# Patient Record
Sex: Male | Born: 1945 | Race: White | Hispanic: No | Marital: Married | Smoking: Never smoker
Health system: Southern US, Community
[De-identification: ages and names within clinical notes are randomized; demographics above are authoritative.]

## PROBLEM LIST (undated history)

## (undated) DIAGNOSIS — IMO0001 Reserved for inherently not codable concepts without codable children: Secondary | ICD-10-CM

## (undated) DIAGNOSIS — R591 Generalized enlarged lymph nodes: Secondary | ICD-10-CM

## (undated) DIAGNOSIS — N138 Other obstructive and reflux uropathy: Secondary | ICD-10-CM

## (undated) DIAGNOSIS — M51369 Other intervertebral disc degeneration, lumbar region without mention of lumbar back pain or lower extremity pain: Secondary | ICD-10-CM

## (undated) DIAGNOSIS — J189 Pneumonia, unspecified organism: Secondary | ICD-10-CM

## (undated) DIAGNOSIS — D381 Neoplasm of uncertain behavior of trachea, bronchus and lung: Secondary | ICD-10-CM

## (undated) DIAGNOSIS — C801 Malignant (primary) neoplasm, unspecified: Secondary | ICD-10-CM

## (undated) DIAGNOSIS — I739 Peripheral vascular disease, unspecified: Secondary | ICD-10-CM

## (undated) DIAGNOSIS — M5136 Other intervertebral disc degeneration, lumbar region: Secondary | ICD-10-CM

## (undated) DIAGNOSIS — E7800 Pure hypercholesterolemia, unspecified: Secondary | ICD-10-CM

## (undated) DIAGNOSIS — R599 Enlarged lymph nodes, unspecified: Secondary | ICD-10-CM

## (undated) DIAGNOSIS — R918 Other nonspecific abnormal finding of lung field: Secondary | ICD-10-CM

## (undated) DIAGNOSIS — R490 Dysphonia: Secondary | ICD-10-CM

## (undated) DIAGNOSIS — N401 Enlarged prostate with lower urinary tract symptoms: Secondary | ICD-10-CM

## (undated) HISTORY — PX: HEMORRHOID SURGERY: SHX153

## (undated) HISTORY — DX: Benign prostatic hyperplasia with lower urinary tract symptoms: N40.1

## (undated) HISTORY — DX: Pure hypercholesterolemia, unspecified: E78.00

## (undated) HISTORY — DX: Enlarged lymph nodes, unspecified: R59.9

## (undated) HISTORY — DX: Neoplasm of uncertain behavior of trachea, bronchus and lung: D38.1

## (undated) HISTORY — PX: COLONOSCOPY: SHX174

## (undated) HISTORY — PX: HERNIA REPAIR: SHX51

## (undated) HISTORY — PX: TONSILLECTOMY: SUR1361

## (undated) HISTORY — DX: Dysphonia: R49.0

## (undated) HISTORY — DX: Generalized enlarged lymph nodes: R59.1

## (undated) HISTORY — DX: Other obstructive and reflux uropathy: N13.8

## (undated) HISTORY — DX: Other nonspecific abnormal finding of lung field: R91.8

---

## 2014-10-16 ENCOUNTER — Other Ambulatory Visit (HOSPITAL_COMMUNITY): Payer: Self-pay | Admitting: Family Medicine

## 2014-10-16 DIAGNOSIS — R918 Other nonspecific abnormal finding of lung field: Secondary | ICD-10-CM

## 2014-10-24 ENCOUNTER — Ambulatory Visit (HOSPITAL_COMMUNITY)
Admission: RE | Admit: 2014-10-24 | Discharge: 2014-10-24 | Disposition: A | Discharge status: Home / Self Care | Payer: Medicare Other | Source: Ambulatory Visit | Attending: Family Medicine | Admitting: Family Medicine

## 2014-10-24 DIAGNOSIS — K769 Liver disease, unspecified: Secondary | ICD-10-CM | POA: Diagnosis not present

## 2014-10-24 DIAGNOSIS — I6523 Occlusion and stenosis of bilateral carotid arteries: Secondary | ICD-10-CM | POA: Diagnosis not present

## 2014-10-24 DIAGNOSIS — J90 Pleural effusion, not elsewhere classified: Secondary | ICD-10-CM | POA: Diagnosis not present

## 2014-10-24 DIAGNOSIS — R590 Localized enlarged lymph nodes: Secondary | ICD-10-CM | POA: Diagnosis not present

## 2014-10-24 DIAGNOSIS — N433 Hydrocele, unspecified: Secondary | ICD-10-CM | POA: Insufficient documentation

## 2014-10-24 DIAGNOSIS — I517 Cardiomegaly: Secondary | ICD-10-CM | POA: Insufficient documentation

## 2014-10-24 DIAGNOSIS — I2510 Atherosclerotic heart disease of native coronary artery without angina pectoris: Secondary | ICD-10-CM | POA: Insufficient documentation

## 2014-10-24 DIAGNOSIS — R918 Other nonspecific abnormal finding of lung field: Secondary | ICD-10-CM | POA: Insufficient documentation

## 2014-10-24 LAB — GLUCOSE, CAPILLARY: Glucose-Capillary: 83 mg/dL (ref 65–99)

## 2014-10-24 MED ORDER — FLUDEOXYGLUCOSE F - 18 (FDG) INJECTION
8.6500 | Freq: Once | INTRAVENOUS | Status: DC | PRN
  Administered 2014-10-24: 8.65 via INTRAVENOUS
  Filled 2014-10-24: qty 8.65

## 2014-10-30 ENCOUNTER — Institutional Professional Consult (permissible substitution) (INDEPENDENT_AMBULATORY_CARE_PROVIDER_SITE_OTHER): Payer: Medicare Other | Admitting: Cardiothoracic Surgery

## 2014-10-30 ENCOUNTER — Encounter: Payer: Self-pay | Admitting: Cardiothoracic Surgery

## 2014-10-30 ENCOUNTER — Encounter: Payer: Self-pay | Admitting: *Deleted

## 2014-10-30 ENCOUNTER — Other Ambulatory Visit: Payer: Self-pay | Admitting: *Deleted

## 2014-10-30 VITALS — BP 118/78 | HR 79 | Resp 16 | Ht 67.5 in | Wt 167.0 lb

## 2014-10-30 DIAGNOSIS — C3431 Malignant neoplasm of lower lobe, right bronchus or lung: Secondary | ICD-10-CM

## 2014-10-30 DIAGNOSIS — N401 Enlarged prostate with lower urinary tract symptoms: Secondary | ICD-10-CM

## 2014-10-30 DIAGNOSIS — R918 Other nonspecific abnormal finding of lung field: Secondary | ICD-10-CM | POA: Diagnosis not present

## 2014-10-30 DIAGNOSIS — D381 Neoplasm of uncertain behavior of trachea, bronchus and lung: Secondary | ICD-10-CM | POA: Insufficient documentation

## 2014-10-30 DIAGNOSIS — N138 Other obstructive and reflux uropathy: Secondary | ICD-10-CM | POA: Insufficient documentation

## 2014-10-30 DIAGNOSIS — R591 Generalized enlarged lymph nodes: Secondary | ICD-10-CM | POA: Diagnosis not present

## 2014-10-30 DIAGNOSIS — R599 Enlarged lymph nodes, unspecified: Secondary | ICD-10-CM

## 2014-10-30 DIAGNOSIS — R490 Dysphonia: Secondary | ICD-10-CM | POA: Insufficient documentation

## 2014-10-30 DIAGNOSIS — E7800 Pure hypercholesterolemia, unspecified: Secondary | ICD-10-CM | POA: Insufficient documentation

## 2014-10-30 NOTE — Progress Notes (Signed)
[address]       [place] [address]             [phone]                    Brady [name] Torboy Medical Record #[id number] Date of Birth: November 07, 1945  Referring: Everardo All, MD Primary Care: Celedonio Savage, MD  Chief Complaint:    Chief Complaint  Patient presents with  . Lung Lesion    multiple...CT'S, PET, MRI BRAIN  . Adenopathy    History of Present Illness:    Brady [name] 69 y.o. male is seen in the office  today for  Concern to extensive lung lesions and mediastinal adenopathy. 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  He is a nonsmoker.  He was in the [place] War however.  HHe is here today with his wife of many years and has one daughter. He has a son as well. Both children live in [place].  He t has not lost weight recently. He does not have fevers or night sweats. He does have a coughwhich started in July after he developed some hoarseness in May of this year. The cough seemed to get worse. He went to see his primary care physician who got a chest x-ray which was suggestive of pneumonia. He was placed on antibiotic's. The follow-up chest x-ray did not show resolution and therefore a CT scan of the chest was done on September 15, 2014. A follow-up CT scan of the chest was recommended and was done in September which revealed multiple pulmonary nodules consistent with a possible primary in the right lower lobe and metastases in both lungs as well as adenopathy He subsequently went on to have a PET scan The other day which revealed bone metastases as well as extensive lung disease. He does not necessarily have neurological symptoms but has not had a scan of his brain.he remains slightly short of breath with exertion.       Current Activity/ Functional  Status:  Patient is independent with mobility/ambulation, transfers, ADL's, IADL's.   Zubrod Score: At the time of surgery this patient's most appropriate activity status/level should be described as: '[]'$     0    Normal activity, no symptoms '[x]'$     1    Restricted in physical strenuous activity but ambulatory, able to do out light work '[]'$     2    Ambulatory and capable of self care, unable to do work activities, up and about               >50 % of waking hours                              '[]'$     3    Only limited self care, in bed greater than 50% of waking hours '[]'$     4    Completely disabled, no self care, confined to bed or chair '[]'$     5    Moribund   Past Medical History  Diagnosis Date  . Hypercholesterolemia   . Persistent hoarseness   . BPH with obstruction/lower urinary tract symptoms   . 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 Multiple pulmonary nodules     bilateral  . Adenopathy     No past surgical history on file.  Family History  Problem Relation Age of Onset  . Sudden death Father   . Cancer Sister 25    metastatic pancreatic cancer   His father died of sudden death at age 14 while teaching in high school. His mother died at age 32 of complications of thyrotoxicosis and other Issues. He has one sister who died at age 62 of metastatic cancer of the pancreas, he has a sister who is overweight with degenerative joint disease status post 3 knee replacements. He has one other brother who was alive and in fair health. The patient is youngest of these 4 children. Social History   Social History  . Marital Status: Married    Spouse Name: N/A  . Number of Children: N/A  . Years of Education: N/A   Occupational History  . Not on file.   Social History Main Topics  . Smoking status: Never Smoker   . Smokeless tobacco: Never Used  . Alcohol Use: 0.0 oz/week    0 Standard drinks or equivalent per week     Comment: EVERY MONTH IF THAT  . Drug  Use: Not on file  . Sexual Activity: Not on file   Other Topics Concern  . Not on file   Social History Narrative    History  Smoking status  . Never Smoker   Smokeless tobacco  . Never Used    History  Alcohol Use  . 0.0 oz/week  . 0 Standard drinks or equivalent per week    Comment: EVERY MONTH IF THAT     Not on File  Current Outpatient Prescriptions  Medication Sig Dispense Refill  . acetaminophen (TYLENOL) 500 MG tablet Take 500 mg by mouth every 6 (six) hours as needed. For pain    . albuterol (PROVENTIL HFA;VENTOLIN HFA) 108 (90 BASE) MCG/ACT inhaler Inhale 2 puffs into the lungs every 6 (six) hours as needed for wheezing or shortness of breath.    Marland Kitchen aspirin 81 MG chewable tablet Chew by mouth daily.    . cetirizine (ZYRTEC) 10 MG tablet Take 10 mg by mouth daily.    . clotrimazole (LOTRIMIN) 1 % cream Apply 1 application topically 2 (two) times daily. As needed    . finasteride (PROSCAR) 5 MG tablet Take 5 mg by mouth daily.    . fluticasone (FLONASE) 50 MCG/ACT nasal spray Place 1 spray into both nostrils daily as needed for allergies or rhinitis.    Marland Kitchen Hydrocodone-Chlorpheniramine (CHLORPHENIRAMINE-HYDROCODONE PO) Take 5 mLs by mouth every 12 (twelve) hours as needed.    . Omega-3 Fatty Acids (FISH OIL) 1000 MG CAPS Take 2 capsules by mouth daily.    Marland Kitchen omeprazole (PRILOSEC) 20 MG capsule Take 20 mg by mouth daily.    Marland Kitchen oxyCODONE-acetaminophen (PERCOCET/ROXICET) 5-325 MG tablet Take 1 tablet by mouth every 6 (six) hours as needed for severe pain.    . simvastatin (ZOCOR) 20 MG tablet Take 20 mg by mouth daily.    Marland Kitchen terazosin (HYTRIN) 5 MG capsule Take 5 mg by mouth at bedtime.     No current facility-administered medications for this visit.      Review of Systems:     Cardiac Review of Systems: Y or N  Chest Pain Florencio.Farrier   ]  Resting SOB [  y ] Exertional SOB  [ y]  Orthopnea Blue.Reese  ]   Pedal Edema [ n  ]    Palpitations Florencio.Farrier  ] Syncope  Florencio.Farrier  ]   Presyncope [ n   ]  General Review of Systems: [Y] = yes [  ]=no Constitional: recent weight change [  ];  Wt loss over the last 3 months [   ] anorexia [  ]; fatigue [  ]; nausea [  ]; night sweats [  ]; fever [  ]; or chills [  ];          Dental: poor dentition[  ]; Last Dentist visit:   Eye : blurred vision [  ]; diplopia [   ]; vision changes [  ];  Amaurosis fugax[  ]; Resp: cough [  ];  wheezing[  ];  hemoptysis[ n ]; shortness of breath[y  ]; paroxysmal nocturnal y]; dyspnea on exertion[y  ]; or orthopnea[ y ];  GI:  gallstones[  ], vomiting[  ];  dysphagia[  ]; melena[  ];  hematochezia [  ]; heartburn[  ];   Hx of  Colonoscopy[  ]; GU: kidney stones [  ]; hematuria[  ];   dysuria [  ];  nocturia[  ];  history of     obstruction [  ]; urinary frequency [  ]             Skin: rash, swelling[  ];, hair loss[  ];  peripheral edema[  ];  or itching[  ]; Musculosketetal: myalgias[  ];  joint swelling[  ];  joint erythema[  ];  joint pain[  ];  back pain[  ];  Heme/Lymph: bruising[  ];  bleeding[  ];  anemia[  ];  Neuro: TIA[  ];  headaches[  ];  stroke[  ];  vertigo[  ];  seizures[  ];   paresthesias[  ];  difficulty walking[  ];  Psych:depression[  ]; anxiety[  ];  Endocrine: diabetes[  ];  thyroid dysfunction[  ];  Immunizations: Flu up to date [  ]; Pneumococcal up to date [  ];  Other:  Physical Exam: BP 118/78 mmHg  Pulse 79  Resp 16  Ht 5' 7.5" (1.715 m)  Wt 167 lb (75.751 kg)  BMI 25.75 kg/m2  SpO2 96%  PHYSICAL EXAMINATION: General appearance: alert, cooperative and voice fatiques, hoarse Head: Normocephalic, without obvious abnormality, atraumatic Neck: no adenopathy, no carotid bruit, no JVD, supple, symmetrical, trachea midline and thyroid not enlarged, symmetric, no tenderness/mass/nodules Lymph nodes: Cervical, supraclavicular, and axillary nodes normal. Resp: diminished breath sounds bibasilar Back: symmetric, no curvature. ROM normal. No CVA tenderness. Cardio: regular rate and  rhythm, S1, S2 normal, no murmur, click, rub or gallop GI: soft, non-tender; bowel sounds normal; no masses,  no organomegaly Extremities: extremities normal, atraumatic, no cyanosis or edema Neurologic: Grossly normal  Diagnostic Studies & Laboratory data:     Recent Radiology Findings:   Nm Pet Image Initial (pi) Skull Base To Thigh  10/24/2014  CLINICAL DATA:  Initial treatment strategy for pulmonary nodules. EXAM: NUCLEAR MEDICINE PET SKULL BASE TO THIGH TECHNIQUE: 8.6 mCi F-18 FDG was injected intravenously. Full-ring PET imaging was performed from the skull base to thigh after the radiotracer. CT data was obtained and used for attenuation correction and anatomic localization. FASTING BLOOD GLUCOSE:  Value: 83 mg/dl COMPARISON:  Recent chest CTs, including 09/15/2014 and 10/16/2014. FINDINGS: NECK A focus of hypermetabolism in the low right jugular region, likely corresponding to a small lymph node. This  measures a S.U.V. max of 4.1 on image/series 45/4. CHEST Multi focal pulmonary and thoracic nodal hypermetabolism. An index partially cavitary posterior left upper lobe lung mass measures 3.5 cm and a S.U.V. max of 17.3 on image/series 33.7. Partially cavitary left lower lobe lung nodule measures 2.2 cm and a S.U.V. max of 13.9 on image/series 42/7 Heterogeneous hypermetabolism corresponding to masslike consolidation throughout the right lower lobe. No air bronchograms within. A right paratracheal node measures 8 mm and a S.U.V. max of 5.3 on image 61. Precarinal adenopathy measures 1.7 cm and a S.U.V. max of 6.4 on image 71. ABDOMEN/PELVIS Bilateral adrenal hypermetabolism is mild and relatively symmetric. No dominant nodule. No abdominal pelvic nodal hypermetabolism. SKELETON Multifocal osseous hypermetabolism. Example within the sacrum at a S.U.V. max of 11.0 on image/series 160/4. sternal body focus of hypermetabolism measures a S.U.V. max of 8.2. CT IMAGES PERFORMED FOR ATTENUATION CORRECTION  Bilateral carotid atherosclerosis. No cervical adenopathy. Chest findings deferred to recent diagnostic CTs. Mild cardiomegaly with coronary artery atherosclerosis. Small right larger than left pleural effusions are similar. Well-circumscribed low-density liver lesions are likely cysts. Abdominal aortic and branch vessel atherosclerosis. Trace cul-de-sac fluid. Mild prostatomegaly. Small bilateral hydroceles may be physiologic. Bilateral L5 pars defects. IMPRESSION: 1. Bilateral hypermetabolic pulmonary nodules, hypermetabolic thoracic nodes and masslike right lower lobe hypermetabolic opacity. Although some of these findings could be infectious or inflammatory, the presence of concurrent hypermetabolic osseous foci is consistent with widespread metastatic primary bronchogenic carcinoma or carcinomas. 2. Hypermetabolism within the low right neck, suspicious for nodal metastasis. 3. No definite extraosseous metastasis below the diaphragm ; adrenal hypermetabolism is without CT correlate and favored to be physiologic. 4.  Atherosclerosis, including within the coronary arteries. 5. Trace cul-de-sac fluid is nonspecific. 6. Small right larger than left pleural effusions are similar to 10/16/2014. Electronically Signed   By: Abigail Miyamoto M.D.   On: 10/24/2014 15:06     I have independently reviewed the above radiologic studies.  Recent Lab Findings: No results found for: WBC, HGB, HCT, PLT, GLUCOSE, CHOL, TRIG, HDL, LDLDIRECT, LDLCALC, ALT, AST, NA, K, CL, CREATININE, BUN, CO2, TSH, INR, GLUF, HGBA1C    Assessment / Plan:   bilateral lung lesions and  Mediastinal adenopathy suggestive of lung cancer. I have recommended proceeding with bronchoscopy, EBUS and mediastinoscopy and biopsy to obtain tissue dx. Will plan for Tuesday 10/18.      I  spent 40 minutes counseling the patient face to face and 50% or more the  time was spent in counseling and coordination of care. The total time spent in the appointment  was 60 minutes.  Grace Isaac MD      [address] [place] [address] Office [phone]   Beeper [phone]  10/30/2014 9:15 PM

## 2014-11-03 ENCOUNTER — Encounter (HOSPITAL_COMMUNITY): Payer: Self-pay | Admitting: *Deleted

## 2014-11-03 NOTE — Progress Notes (Signed)
Spoke with pt's wife, Arbie Cookey for pre-op call after getting verbal permission from pt.  She denies any cardiac history on pt. Denies any c/o chest pain. She states pt has had some sob due to recent lung cancer diagnosis.

## 2014-11-03 NOTE — Progress Notes (Signed)
Notified pt's wife, Arbie Cookey of surgery time change. Instructed her to have pt here at 7:00 AM for a 9:30 surgery start time. She voiced understanding.

## 2014-11-04 ENCOUNTER — Ambulatory Visit (HOSPITAL_COMMUNITY)
Admission: RE | Admit: 2014-11-04 | Discharge: 2014-11-04 | Disposition: A | Discharge status: Home / Self Care | Payer: Medicare Other | Source: Ambulatory Visit | Attending: Cardiothoracic Surgery | Admitting: Cardiothoracic Surgery

## 2014-11-04 ENCOUNTER — Ambulatory Visit (HOSPITAL_COMMUNITY): Payer: Medicare Other | Admitting: Certified Registered Nurse Anesthetist

## 2014-11-04 ENCOUNTER — Ambulatory Visit (HOSPITAL_COMMUNITY): Payer: Medicare Other

## 2014-11-04 ENCOUNTER — Encounter (HOSPITAL_COMMUNITY)
Admission: RE | Disposition: A | Discharge status: Home / Self Care | Payer: Self-pay | Source: Ambulatory Visit | Attending: Cardiothoracic Surgery

## 2014-11-04 ENCOUNTER — Encounter (HOSPITAL_COMMUNITY): Payer: Self-pay | Admitting: *Deleted

## 2014-11-04 DIAGNOSIS — C3431 Malignant neoplasm of lower lobe, right bronchus or lung: Secondary | ICD-10-CM | POA: Diagnosis not present

## 2014-11-04 DIAGNOSIS — E7800 Pure hypercholesterolemia, unspecified: Secondary | ICD-10-CM | POA: Diagnosis not present

## 2014-11-04 DIAGNOSIS — I739 Peripheral vascular disease, unspecified: Secondary | ICD-10-CM | POA: Insufficient documentation

## 2014-11-04 DIAGNOSIS — N138 Other obstructive and reflux uropathy: Secondary | ICD-10-CM | POA: Diagnosis not present

## 2014-11-04 DIAGNOSIS — M5136 Other intervertebral disc degeneration, lumbar region: Secondary | ICD-10-CM | POA: Insufficient documentation

## 2014-11-04 DIAGNOSIS — Z79899 Other long term (current) drug therapy: Secondary | ICD-10-CM | POA: Diagnosis not present

## 2014-11-04 DIAGNOSIS — N401 Enlarged prostate with lower urinary tract symptoms: Secondary | ICD-10-CM | POA: Diagnosis not present

## 2014-11-04 DIAGNOSIS — Z7982 Long term (current) use of aspirin: Secondary | ICD-10-CM | POA: Diagnosis not present

## 2014-11-04 DIAGNOSIS — R918 Other nonspecific abnormal finding of lung field: Secondary | ICD-10-CM

## 2014-11-04 DIAGNOSIS — C771 Secondary and unspecified malignant neoplasm of intrathoracic lymph nodes: Secondary | ICD-10-CM | POA: Insufficient documentation

## 2014-11-04 DIAGNOSIS — R911 Solitary pulmonary nodule: Secondary | ICD-10-CM

## 2014-11-04 DIAGNOSIS — I719 Aortic aneurysm of unspecified site, without rupture: Secondary | ICD-10-CM | POA: Insufficient documentation

## 2014-11-04 HISTORY — DX: Malignant (primary) neoplasm, unspecified: C80.1

## 2014-11-04 HISTORY — DX: Pneumonia, unspecified organism: J18.9

## 2014-11-04 HISTORY — PX: VIDEO BRONCHOSCOPY WITH ENDOBRONCHIAL ULTRASOUND: SHX6177

## 2014-11-04 HISTORY — DX: Other intervertebral disc degeneration, lumbar region without mention of lumbar back pain or lower extremity pain: M51.369

## 2014-11-04 HISTORY — PX: MEDIASTINOSCOPY: SHX5086

## 2014-11-04 HISTORY — DX: Peripheral vascular disease, unspecified: I73.9

## 2014-11-04 HISTORY — DX: Other intervertebral disc degeneration, lumbar region: M51.36

## 2014-11-04 HISTORY — DX: Reserved for inherently not codable concepts without codable children: IMO0001

## 2014-11-04 LAB — CBC
HCT: 40.4 % (ref 39.0–52.0)
Hemoglobin: 13.3 g/dL (ref 13.0–17.0)
MCH: 29.4 pg (ref 26.0–34.0)
MCHC: 32.9 g/dL (ref 30.0–36.0)
MCV: 89.2 fL (ref 78.0–100.0)
Platelets: 290 10*3/uL (ref 150–400)
RBC: 4.53 MIL/uL (ref 4.22–5.81)
RDW: 13 % (ref 11.5–15.5)
WBC: 10.9 10*3/uL — ABNORMAL HIGH (ref 4.0–10.5)

## 2014-11-04 LAB — SURGICAL PCR SCREEN
MRSA, PCR: NEGATIVE
Staphylococcus aureus: NEGATIVE

## 2014-11-04 LAB — COMPREHENSIVE METABOLIC PANEL
ALT: 39 U/L (ref 17–63)
AST: 25 U/L (ref 15–41)
Albumin: 2.8 g/dL — ABNORMAL LOW (ref 3.5–5.0)
Alkaline Phosphatase: 79 U/L (ref 38–126)
Anion gap: 10 (ref 5–15)
BUN: 9 mg/dL (ref 6–20)
CO2: 25 mmol/L (ref 22–32)
Calcium: 8.7 mg/dL — ABNORMAL LOW (ref 8.9–10.3)
Chloride: 100 mmol/L — ABNORMAL LOW (ref 101–111)
Creatinine, Ser: 0.83 mg/dL (ref 0.61–1.24)
GFR calc Af Amer: >60 mL/min (ref >60)
GFR calc non Af Amer: >60 mL/min (ref >60)
Glucose, Bld: 98 mg/dL (ref 65–99)
Potassium: 4 mmol/L (ref 3.5–5.1)
Sodium: 135 mmol/L (ref 135–145)
Total Bilirubin: 0.4 mg/dL (ref 0.3–1.2)
Total Protein: 6.6 g/dL (ref 6.5–8.1)

## 2014-11-04 LAB — TYPE AND SCREEN
ABO/RH(D): AB POS
Antibody Screen: NEGATIVE

## 2014-11-04 LAB — ABO/RH: ABO/RH(D): AB POS

## 2014-11-04 LAB — GRAM STAIN

## 2014-11-04 LAB — PROTIME-INR
INR: 1.19 (ref 0.00–1.49)
Prothrombin Time: 15.3 seconds — ABNORMAL HIGH (ref 11.6–15.2)

## 2014-11-04 LAB — APTT: aPTT: 33 seconds (ref 24–37)

## 2014-11-04 SURGERY — BRONCHOSCOPY, WITH EBUS
Anesthesia: General

## 2014-11-04 MED ORDER — CHLORHEXIDINE GLUCONATE CLOTH 2 % EX PADS: 6.0000 | MEDICATED_PAD | Freq: Once | CUTANEOUS | Status: DC

## 2014-11-04 MED ORDER — PHENYLEPHRINE HCL 10 MG/ML IJ SOLN
10.0000 mg | INTRAVENOUS | Status: DC | PRN
  Administered 2014-11-04: 25 ug/min via INTRAVENOUS

## 2014-11-04 MED ORDER — FENTANYL CITRATE (PF) 250 MCG/5ML IJ SOLN
INTRAMUSCULAR | Status: DC | PRN
  Administered 2014-11-04: 50 ug via INTRAVENOUS
  Administered 2014-11-04: 100 ug via INTRAVENOUS
  Administered 2014-11-04: 50 ug via INTRAVENOUS

## 2014-11-04 MED ORDER — EPHEDRINE SULFATE 50 MG/ML IJ SOLN
INTRAMUSCULAR | Status: AC
  Filled 2014-11-04: qty 1

## 2014-11-04 MED ORDER — GLYCOPYRROLATE 0.2 MG/ML IJ SOLN
INTRAMUSCULAR | Status: AC
  Filled 2014-11-04: qty 1

## 2014-11-04 MED ORDER — DEXTROSE 5 % IV SOLN
1.5000 g | INTRAVENOUS | Status: AC
  Administered 2014-11-04: 1.5 g via INTRAVENOUS
  Filled 2014-11-04: qty 1.5

## 2014-11-04 MED ORDER — VECURONIUM BROMIDE 10 MG IV SOLR
INTRAVENOUS | Status: DC | PRN
  Administered 2014-11-04: 1 mg via INTRAVENOUS
  Administered 2014-11-04: 3 mg via INTRAVENOUS
  Administered 2014-11-04: 1 mg via INTRAVENOUS

## 2014-11-04 MED ORDER — SODIUM CHLORIDE 0.9 % IJ SOLN
INTRAMUSCULAR | Status: AC
  Filled 2014-11-04: qty 10

## 2014-11-04 MED ORDER — FENTANYL CITRATE (PF) 250 MCG/5ML IJ SOLN
INTRAMUSCULAR | Status: AC
  Filled 2014-11-04: qty 5

## 2014-11-04 MED ORDER — PROPOFOL 10 MG/ML IV BOLUS
INTRAVENOUS | Status: DC | PRN
  Administered 2014-11-04: 200 mg via INTRAVENOUS

## 2014-11-04 MED ORDER — LIDOCAINE HCL 4 % MT SOLN
OROMUCOSAL | Status: DC | PRN
Start: 2014-11-04 — End: 2014-11-04
  Administered 2014-11-04: 4 mL via TOPICAL

## 2014-11-04 MED ORDER — MIDAZOLAM HCL 2 MG/2ML IJ SOLN
INTRAMUSCULAR | Status: AC
  Filled 2014-11-04: qty 4

## 2014-11-04 MED ORDER — FENTANYL CITRATE (PF) 100 MCG/2ML IJ SOLN: 25.0000 ug | INTRAMUSCULAR | Status: DC | PRN

## 2014-11-04 MED ORDER — 0.9 % SODIUM CHLORIDE (POUR BTL) OPTIME
TOPICAL | Status: DC | PRN
  Administered 2014-11-04: 1000 mL

## 2014-11-04 MED ORDER — LIDOCAINE HCL (CARDIAC) 20 MG/ML IV SOLN
INTRAVENOUS | Status: DC | PRN
  Administered 2014-11-04: 50 mg via INTRAVENOUS

## 2014-11-04 MED ORDER — SUGAMMADEX SODIUM 200 MG/2ML IV SOLN
INTRAVENOUS | Status: DC | PRN
  Administered 2014-11-04: 200 mg via INTRAVENOUS

## 2014-11-04 MED ORDER — PROPOFOL 10 MG/ML IV BOLUS
INTRAVENOUS | Status: AC
  Filled 2014-11-04: qty 20

## 2014-11-04 MED ORDER — MUPIROCIN 2 % EX OINT
1.0000 "application " | TOPICAL_OINTMENT | Freq: Once | CUTANEOUS | Status: AC
  Administered 2014-11-04: 1 via TOPICAL
  Filled 2014-11-04: qty 22

## 2014-11-04 MED ORDER — ROCURONIUM BROMIDE 50 MG/5ML IV SOLN
INTRAVENOUS | Status: AC
  Filled 2014-11-04: qty 1

## 2014-11-04 MED ORDER — LACTATED RINGERS IV SOLN
INTRAVENOUS | Status: DC
  Administered 2014-11-04 (×2): via INTRAVENOUS

## 2014-11-04 MED ORDER — ONDANSETRON HCL 4 MG/2ML IJ SOLN
INTRAMUSCULAR | Status: DC | PRN
  Administered 2014-11-04: 4 mg via INTRAVENOUS

## 2014-11-04 MED ORDER — MIDAZOLAM HCL 5 MG/5ML IJ SOLN
INTRAMUSCULAR | Status: DC | PRN
  Administered 2014-11-04: 2 mg via INTRAVENOUS

## 2014-11-04 MED ORDER — SUCCINYLCHOLINE CHLORIDE 20 MG/ML IJ SOLN
INTRAMUSCULAR | Status: AC
  Filled 2014-11-04: qty 1

## 2014-11-04 MED ORDER — LIDOCAINE HCL (CARDIAC) 20 MG/ML IV SOLN
INTRAVENOUS | Status: AC
  Filled 2014-11-04: qty 5

## 2014-11-04 MED ORDER — ROCURONIUM BROMIDE 100 MG/10ML IV SOLN
INTRAVENOUS | Status: DC | PRN
  Administered 2014-11-04: 50 mg via INTRAVENOUS

## 2014-11-04 SURGICAL SUPPLY — 54 items
BLADE SURG 10 STRL SS (BLADE) ×2 IMPLANT
BRUSH CYTOL CELLEBRITY 1.5X140 (MISCELLANEOUS) IMPLANT
BRUSH CYTOL CELLEBRITY 1.9X150 (MISCELLANEOUS) ×2 IMPLANT
CANISTER SUCTION 2500CC (MISCELLANEOUS) ×4 IMPLANT
CLIP TI MEDIUM 6 (CLIP) ×2 IMPLANT
CONT SPEC 4OZ CLIKSEAL STRL BL (MISCELLANEOUS) ×6 IMPLANT
COVER DOME SNAP 22 D (MISCELLANEOUS) ×2 IMPLANT
COVER SURGICAL LIGHT HANDLE (MISCELLANEOUS) ×4 IMPLANT
COVER TABLE BACK 60X90 (DRAPES) ×2 IMPLANT
DERMABOND ADVANCED (GAUZE/BANDAGES/DRESSINGS) ×1
DERMABOND ADVANCED .7 DNX12 (GAUZE/BANDAGES/DRESSINGS) ×1 IMPLANT
DRAPE LAPAROTOMY T 102X78X121 (DRAPES) ×2 IMPLANT
DRSG AQUACEL AG ADV 3.5X14 (GAUZE/BANDAGES/DRESSINGS) ×2 IMPLANT
ELECT CAUTERY BLADE 6.4 (BLADE) ×2 IMPLANT
ELECT REM PT RETURN 9FT ADLT (ELECTROSURGICAL) ×2
ELECTRODE REM PT RTRN 9FT ADLT (ELECTROSURGICAL) ×1 IMPLANT
FILTER STRAW FLUID ASPIR (MISCELLANEOUS) ×2 IMPLANT
FORCEPS BIOP RJ4 1.8 (CUTTING FORCEPS) IMPLANT
FORCEPS RADIAL JAW LRG 4 PULM (INSTRUMENTS) ×1 IMPLANT
GAUZE SPONGE 4X4 12PLY STRL (GAUZE/BANDAGES/DRESSINGS) ×4 IMPLANT
GAUZE SPONGE 4X4 16PLY XRAY LF (GAUZE/BANDAGES/DRESSINGS) ×2 IMPLANT
GLOVE BIO SURGEON STRL SZ 6.5 (GLOVE) ×4 IMPLANT
GOWN STRL REUS W/ TWL LRG LVL3 (GOWN DISPOSABLE) ×1 IMPLANT
GOWN STRL REUS W/TWL LRG LVL3 (GOWN DISPOSABLE) ×1
HEMOSTAT SURGICEL 2X14 (HEMOSTASIS) IMPLANT
KIT BASIN OR (CUSTOM PROCEDURE TRAY) ×2 IMPLANT
KIT CLEAN ENDO COMPLIANCE (KITS) ×6 IMPLANT
KIT ROOM TURNOVER OR (KITS) ×4 IMPLANT
MARKER SKIN DUAL TIP RULER LAB (MISCELLANEOUS) ×2 IMPLANT
NEEDLE BIOPSY TRANSBRONCH 21G (NEEDLE) IMPLANT
NEEDLE BLUNT 18X1 FOR OR ONLY (NEEDLE) IMPLANT
NEEDLE SONO TIP II EBUS (NEEDLE) ×2 IMPLANT
NS IRRIG 1000ML POUR BTL (IV SOLUTION) ×4 IMPLANT
OIL SILICONE PENTAX (PARTS (SERVICE/REPAIRS)) ×2 IMPLANT
PACK SURGICAL SETUP 50X90 (CUSTOM PROCEDURE TRAY) ×2 IMPLANT
PAD ARMBOARD 7.5X6 YLW CONV (MISCELLANEOUS) ×8 IMPLANT
PENCIL BUTTON HOLSTER BLD 10FT (ELECTRODE) ×2 IMPLANT
RADIAL JAW LRG 4 PULMONARY (INSTRUMENTS) ×1
SPONGE INTESTINAL PEANUT (DISPOSABLE) ×2 IMPLANT
STAPLER VISISTAT 35W (STAPLE) IMPLANT
SUT VIC AB 3-0 SH 18 (SUTURE) ×2 IMPLANT
SUT VICRYL 4-0 PS2 18IN ABS (SUTURE) ×2 IMPLANT
SWAB COLLECTION DEVICE MRSA (MISCELLANEOUS) IMPLANT
SYR 20CC LL (SYRINGE) ×2 IMPLANT
SYR 20ML ECCENTRIC (SYRINGE) ×2 IMPLANT
SYR 3ML LL SCALE MARK (SYRINGE) ×2 IMPLANT
SYRINGE 10CC LL (SYRINGE) ×2 IMPLANT
TOWEL OR 17X24 6PK STRL BLUE (TOWEL DISPOSABLE) ×4 IMPLANT
TOWEL OR 17X26 10 PK STRL BLUE (TOWEL DISPOSABLE) ×2 IMPLANT
TRAP SPECIMEN MUCOUS 40CC (MISCELLANEOUS) ×2 IMPLANT
TUBE ANAEROBIC SPECIMEN COL (MISCELLANEOUS) IMPLANT
TUBE CONNECTING 12X1/4 (SUCTIONS) ×2 IMPLANT
TUBE CONNECTING 20X1/4 (TUBING) ×2 IMPLANT
WATER STERILE IRR 1000ML POUR (IV SOLUTION) ×2 IMPLANT

## 2014-11-04 NOTE — Transfer of Care (Signed)
Immediate Anesthesia Transfer of Care Note  Patient: Brady [name]  Procedure(s) Performed: Procedure(s): VIDEO BRONCHOSCOPY WITH ENDOBRONCHIAL ULTRASOUND (N/A) POSSIBLE MEDIASTINOSCOPY (N/A)  Patient Location: PACU  Anesthesia Type:General  Level of Consciousness: awake, alert  and oriented  Airway & Oxygen Therapy: Patient Spontanous Breathing and Patient connected to nasal cannula oxygen  Post-op Assessment: Report given to RN and Post -op Vital signs reviewed and stable  Post vital signs: Reviewed and stable  Last Vitals:  Filed Vitals:   11/04/14 1116  BP: 112/95  Pulse:   Temp: 37 C  Resp: 23    Complications: No apparent anesthesia complications

## 2014-11-04 NOTE — Anesthesia Postprocedure Evaluation (Signed)
  Anesthesia Post-op Note  Patient: Brady [name]  Procedure(s) Performed: Procedure(s): VIDEO BRONCHOSCOPY WITH ENDOBRONCHIAL ULTRASOUND (N/A) POSSIBLE MEDIASTINOSCOPY (N/A)  Patient Location: PACU  Anesthesia Type:General  Level of Consciousness: awake and alert   Airway and Oxygen Therapy: Patient Spontanous Breathing  Post-op Pain: Controlled  Post-op Assessment: Post-op Vital signs reviewed, Patient's Cardiovascular Status Stable and Respiratory Function Stable  Post-op Vital Signs: Reviewed  Filed Vitals:   11/04/14 1215  BP: 97/57  Pulse:   Temp:   Resp:     Complications: No apparent anesthesia complications

## 2014-11-04 NOTE — Anesthesia Preprocedure Evaluation (Addendum)
Anesthesia Evaluation  Patient identified by MRN, date of birth, ID band Patient awake    Reviewed: Allergy & Precautions, H&P , NPO status , Patient's Chart, lab work & pertinent test results  Airway Mallampati: II  TM Distance: >3 FB Neck ROM: Full    Dental no notable dental hx. (+) Teeth Intact, Dental Advisory Given   Pulmonary shortness of breath and with exertion,    Pulmonary exam normal breath sounds clear to auscultation       Cardiovascular + Peripheral Vascular Disease  negative cardio ROS   Rhythm:Regular Rate:Normal     Neuro/Psych negative neurological ROS  negative psych ROS   GI/Hepatic negative GI ROS, Neg liver ROS,   Endo/Other  negative endocrine ROS  Renal/GU negative Renal ROS  negative genitourinary   Musculoskeletal  (+) Arthritis , Osteoarthritis,    Abdominal   Peds  Hematology negative hematology ROS (+)   Anesthesia Other Findings   Reproductive/Obstetrics negative OB ROS                            Anesthesia Physical Anesthesia Plan  ASA: III  Anesthesia Plan: General   Post-op Pain Management:    Induction: Intravenous  Airway Management Planned: Oral ETT  Additional Equipment:   Intra-op Plan:   Post-operative Plan: Extubation in OR  Informed Consent: I have reviewed the patients History and Physical, chart, labs and discussed the procedure including the risks, benefits and alternatives for the proposed anesthesia with the patient or authorized representative who has indicated his/her understanding and acceptance.   Dental advisory given  Plan Discussed with: CRNA  Anesthesia Plan Comments:         Anesthesia Quick Evaluation

## 2014-11-04 NOTE — Discharge Instructions (Signed)
Flexible Bronchoscopy, Care After Refer to this sheet in the next few weeks. These instructions provide you with information on caring for yourself after your procedure. Your health care provider may also give you more specific instructions. Your treatment has been planned according to current medical practices, but problems sometimes occur. Call your health care provider if you have any problems or questions after your procedure.  WHAT TO EXPECT AFTER THE PROCEDURE It is normal to have the following symptoms for 24-48 hours after the procedure:   Increased cough.  Low-grade fever.  Sore throat or hoarse voice.  Small streaks of blood in your thick spit (sputum) if tissue samples were taken (biopsy). HOME CARE INSTRUCTIONS   Do not eat or drink anything for 2 hours after your procedure. Your nose and throat were numbed by medicine. If you try to eat or drink before the medicine wears off, food or drink could go into your lungs or you could burn yourself. After the numbness is gone and your cough and gag reflexes have returned, you may eat soft food and drink liquids slowly.   The day after the procedure, you can go back to your normal diet.   You may resume normal activities.   Keep all follow-up visits as directed by your health care provider. It is important to keep all your appointments, especially if tissue samples were taken for testing (biopsy). SEEK IMMEDIATE MEDICAL CARE IF:   You have increasing shortness of breath.   You become light-headed or faint.   You have chest pain.   You have any new concerning symptoms.  You cough up more than a small amount of blood.  The amount of blood you cough up increases. MAKE SURE YOU:  Understand these instructions.  Will watch your condition.  Will get help right away if you are not doing well or get worse.   This information is not intended to replace advice given to you by your health care provider. Make sure you discuss  any questions you have with your health care provider.   Document Released: 07/23/2004 Document Revised: 01/24/2014 Document Reviewed: 09/07/2012 Elsevier Interactive Patient Education [employer].

## 2014-11-04 NOTE — Anesthesia Procedure Notes (Signed)
Procedure Name: Intubation Date/Time: 11/04/2014 9:31 AM Performed by: Layla Maw Pre-anesthesia Checklist: Patient identified, Timeout performed, Emergency Drugs available, Suction available and Patient being monitored Patient Re-evaluated:Patient Re-evaluated prior to inductionOxygen Delivery Method: Circle system utilized Preoxygenation: Pre-oxygenation with 100% oxygen Intubation Type: IV induction Ventilation: Mask ventilation without difficulty and Oral airway inserted - appropriate to patient size Laryngoscope Size: Sabra Heck and 2 Grade View: Grade I Tube type: Oral Tube size: 8.5 mm Number of attempts: 1 Airway Equipment and Method: LTA kit utilized Placement Confirmation: ETT inserted through vocal cords under direct vision,  breath sounds checked- equal and bilateral and positive ETCO2 Tube secured with: Tape Dental Injury: Teeth and Oropharynx as per pre-operative assessment

## 2014-11-04 NOTE — Brief Op Note (Signed)
11/04/2014  11:03 AM  PATIENT:  Brady [name]  69 y.o. male  PRE-OPERATIVE DIAGNOSIS:  PULMONARY NODULES  POST-OPERATIVE DIAGNOSIS:  PULMONARY NODULES- non small cell lung cancer by quick smear   PROCEDURE:  Procedure(s): VIDEO BRONCHOSCOPY WITH ENDOBRONCHIAL ULTRASOUND (N/A) Biopsy of right lower lobe and transbronchial biopsy of #7 nodes  SURGEON:  Surgeon(s) and Role:    * Grace Isaac, MD - Primary   ANESTHESIA:   general  EBL:  Total I/O In: 1000 [I.V.:1000] Out: -   BLOOD ADMINISTERED:none  DRAINS: none   LOCAL MEDICATIONS USED:  NONE  SPECIMEN:  Source of Specimen:  right lower lobe and # 7 nodes  DISPOSITION OF SPECIMEN:  PATHOLOGY  COUNTS:  YES   DICTATION: .Dragon Dictation  PLAN OF CARE: Discharge to home after PACU  PATIENT DISPOSITION:  PACU - hemodynamically stable.   Delay start of Pharmacological VTE agent (>24hrs) due to surgical blood loss or risk of bleeding: yes

## 2014-11-04 NOTE — H&P (Signed)
[address]       [place] [address]             [facility] Medical Record #[id number] Date of Birth: 1945-02-16  Referring: Dr Gwenette Greet Primary Care: Celedonio Savage, MD  Chief Complaint:    cough   History of Present Illness:    Brady [name] 69 y.o. male is seen in the office   for  Concern to extensive lung lesions and mediastinal adenopathy. Patient is life long non smoker.this is a very pleasant Brady is 69 year old retired  [name]   for many many years. Brady retired in May 2015. 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  Brady is a nonsmoker.  Brady was in the [place] War however.  HHe is here today with his wife of many years and has one daughter. Brady has a son as well. Both children live in [place].  Brady t has not lost weight recently. Brady does not have fevers or night sweats. Brady does have a coughwhich started in July after Brady developed some hoarseness in May of this year. The cough seemed to get worse. Brady went to see his primary care physician who got a chest x-ray which was suggestive of pneumonia. Brady was placed on antibiotic's. The follow-up chest x-ray did not show resolution and therefore a CT scan of the chest was done on September 15, 2014. A follow-up CT scan of the chest was recommended and was done in September which revealed multiple pulmonary nodules consistent with a possible primary in the right lower lobe and metastases in both lungs as well as adenopathy Brady subsequently went on to have a PET scan The other day which revealed bone metastases as well as extensive lung disease. Brady does not necessarily have neurological symptoms but has not had a scan of his brain.Brady remains slightly short of breath with exertion.       Current Activity/ Functional Status:  Patient is independent with mobility/ambulation, transfers, ADL's, IADL's.   Zubrod Score: At the time of  surgery this patient's most appropriate activity status/level should be described as: '[]'$     0    Normal activity, no symptoms '[x]'$     1    Restricted in physical strenuous activity but ambulatory, able to do out light work '[]'$     2    Ambulatory and capable of self care, unable to do work activities, up and about               >50 % of waking hours                              '[]'$     3    Only limited self care, in bed greater than 50% of waking hours '[]'$     4    Completely disabled, no self care, confined to bed or chair '[]'$     5    Moribund   Past Medical History  Diagnosis Date  . Hypercholesterolemia   . Persistent hoarseness   . BPH with obstruction/lower urinary tract symptoms   . 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 Multiple pulmonary nodules     bilateral  . Adenopathy   .  Shortness of breath dyspnea   . Peripheral vascular disease ([place])     aortic aneurysm - being monitored (family has hx)  . Pneumonia   . Cancer ([place])     lung - 10/2014  . DDD (degenerative disc disease), lumbar     Past Surgical History  Procedure Laterality Date  . Hemorrhoid surgery    . Tonsillectomy    . Colonoscopy    . Hernia repair      Family History  Problem Relation Age of Onset  . Sudden death Father   . Cancer Sister 45    metastatic pancreatic cancer   His father died of sudden death at age 24 while teaching in high school. His mother died at age 28 of complications of thyrotoxicosis and other Issues. Brady has one sister who died at age 28 of metastatic cancer of the pancreas, Brady has a sister who is overweight with degenerative joint disease status post 3 knee replacements. Brady has one other brother who was alive and in fair health. The patient is youngest of these 4 children. Social History   Social History  . Marital Status: Married    Spouse Name: N/A  . Number of Children: N/A  . Years of Education: N/A   Occupational History  . Not on file.   Social  History Main Topics  . Smoking status: Never Smoker   . Smokeless tobacco: Never Used  . Alcohol Use: 0.0 oz/week    0 Standard drinks or equivalent per week     Comment: EVERY MONTH IF THAT  . Drug Use: No  . Sexual Activity: Not on file   Other Topics Concern  . Not on file   Social History Narrative    History  Smoking status  . Never Smoker   Smokeless tobacco  . Never Used    History  Alcohol Use  . 0.0 oz/week  . 0 Standard drinks or equivalent per week    Comment: EVERY MONTH IF THAT     No Known Allergies  Current Facility-Administered Medications  Medication Dose Route Frequency Provider Last Rate Last Dose  . cefUROXime (ZINACEF) 1.5 g in dextrose 5 % 50 mL IVPB  1.5 g Intravenous 60 min Pre-Op Grace Isaac, MD      . Chlorhexidine Gluconate Cloth 2 % PADS 6 each  6 each Topical Once Grace Isaac, MD      . mupirocin ointment (BACTROBAN) 2 % 1 application  1 application Topical Once Grace Isaac, MD          Review of Systems:     Cardiac Review of Systems: Y or N  Chest Pain Florencio.Farrier   ]  Resting SOB [  y ] Exertional SOB  [ y]  Orthopnea Blue.Reese  ]   Pedal Edema [ n  ]    Palpitations [n  ] Syncope  Florencio.Farrier  ]   Presyncope [ n  ]  General Review of Systems: [Y] = yes [  ]=no Constitional: recent weight change [  ];  Wt loss over the last 3 months [   ] anorexia [  ]; fatigue [  ]; nausea [  ]; night sweats [  ]; fever [  ]; or chills [  ];          Dental: poor dentition[  ]; Last Dentist visit:   Eye : blurred vision [  ]; diplopia [   ]; vision changes [  ];  Amaurosis fugax[  ]; Resp: cough [  ];  wheezing[  ];  hemoptysis[ n ]; shortness of breath[y  ]; paroxysmal nocturnal y]; dyspnea on exertion[y  ]; or orthopnea[ y ];  GI:  gallstones[  ], vomiting[  ];  dysphagia[  ]; melena[  ];  hematochezia [  ]; heartburn[  ];   Hx of  Colonoscopy[  ]; GU: kidney stones [  ]; hematuria[  ];   dysuria [  ];  nocturia[  ];  history of     obstruction [  ];  urinary frequency [  ]             Skin: rash, swelling[  ];, hair loss[  ];  peripheral edema[  ];  or itching[  ]; Musculosketetal: myalgias[  ];  joint swelling[  ];  joint erythema[  ];  joint pain[  ];  back pain[  ];  Heme/Lymph: bruising[  ];  bleeding[  ];  anemia[  ];  Neuro: TIA[  ];  headaches[  ];  stroke[  ];  vertigo[  ];  seizures[  ];   paresthesias[  ];  difficulty walking[  ];  Psych:depression[  ]; anxiety[  ];  Endocrine: diabetes[  ];  thyroid dysfunction[  ];  Immunizations: Flu up to date [  ]; Pneumococcal up to date [  ];  Other:  Physical Exam: BP 124/74 mmHg  Pulse 80  Temp(Src) 97.3 F (36.3 C) (Oral)  Resp 16  Wt 167 lb (75.751 kg)  SpO2 96%  PHYSICAL EXAMINATION: General appearance: alert, cooperative and voice fatiques, hoarse Head: Normocephalic, without obvious abnormality, atraumatic Neck: no adenopathy, no carotid bruit, no JVD, supple, symmetrical, trachea midline and thyroid not enlarged, symmetric, no tenderness/mass/nodules Lymph nodes: Cervical, supraclavicular, and axillary nodes normal. Resp: diminished breath sounds bibasilar Back: symmetric, no curvature. ROM normal. No CVA tenderness. Cardio: regular rate and rhythm, S1, S2 normal, no murmur, click, rub or gallop GI: soft, non-tender; bowel sounds normal; no masses,  no organomegaly Extremities: extremities normal, atraumatic, no cyanosis or edema Neurologic: Grossly normal  Diagnostic Studies & Laboratory data:     Recent Radiology Findings:   Dg Chest 2 View  11/04/2014  CLINICAL DATA:  Pulmonary nodules EXAM: CHEST  2 VIEW COMPARISON:  CT chest 10/16/2014 FINDINGS: Small right pleural effusion unchanged. There is right lower lobe consolidation compatible with atelectasis unchanged from the CT. Right lower lobe lung nodules are better seen on prior CT Left upper lobe mass/ infiltrate is unchanged measures and approximately 3 x 5 cm. Mild patchy densities in the left lung base.  Negative for heart failure.  No pleural effusion on the left. IMPRESSION: Right lower lobe consolidation and right pleural effusion unchanged. Left upper lobe irregular density may represent a mass lesion versus pneumonia. Smaller left lower lobe nodules. See prior CT report. Electronically Signed   By: Franchot Gallo M.D.   On: 11/04/2014 07:18   Nm Pet Image Initial (pi) Skull Base To Thigh  10/24/2014  CLINICAL DATA:  Initial treatment strategy for pulmonary nodules. EXAM: NUCLEAR MEDICINE PET SKULL BASE TO THIGH TECHNIQUE: 8.6 mCi F-18 FDG was injected intravenously. Full-ring PET imaging was performed from the skull base to thigh after the radiotracer. CT data was obtained and used for attenuation correction and anatomic localization. FASTING BLOOD GLUCOSE:  Value: 83 mg/dl COMPARISON:  Recent chest CTs, including 09/15/2014 and 10/16/2014. FINDINGS: NECK A focus of hypermetabolism in the low right jugular region, likely corresponding to  a small lymph node. This measures a S.U.V. max of 4.1 on image/series 45/4. CHEST Multi focal pulmonary and thoracic nodal hypermetabolism. An index partially cavitary posterior left upper lobe lung mass measures 3.5 cm and a S.U.V. max of 17.3 on image/series 33.7. Partially cavitary left lower lobe lung nodule measures 2.2 cm and a S.U.V. max of 13.9 on image/series 42/7 Heterogeneous hypermetabolism corresponding to masslike consolidation throughout the right lower lobe. No air bronchograms within. A right paratracheal node measures 8 mm and a S.U.V. max of 5.3 on image 61. Precarinal adenopathy measures 1.7 cm and a S.U.V. max of 6.4 on image 71. ABDOMEN/PELVIS Bilateral adrenal hypermetabolism is mild and relatively symmetric. No dominant nodule. No abdominal pelvic nodal hypermetabolism. SKELETON Multifocal osseous hypermetabolism. Example within the sacrum at a S.U.V. max of 11.0 on image/series 160/4. sternal body focus of hypermetabolism measures a S.U.V. max of  8.2. CT IMAGES PERFORMED FOR ATTENUATION CORRECTION Bilateral carotid atherosclerosis. No cervical adenopathy. Chest findings deferred to recent diagnostic CTs. Mild cardiomegaly with coronary artery atherosclerosis. Small right larger than left pleural effusions are similar. Well-circumscribed low-density liver lesions are likely cysts. Abdominal aortic and branch vessel atherosclerosis. Trace cul-de-sac fluid. Mild prostatomegaly. Small bilateral hydroceles may be physiologic. Bilateral L5 pars defects. IMPRESSION: 1. Bilateral hypermetabolic pulmonary nodules, hypermetabolic thoracic nodes and masslike right lower lobe hypermetabolic opacity. Although some of these findings could be infectious or inflammatory, the presence of concurrent hypermetabolic osseous foci is consistent with widespread metastatic primary bronchogenic carcinoma or carcinomas. 2. Hypermetabolism within the low right neck, suspicious for nodal metastasis. 3. No definite extraosseous metastasis below the diaphragm ; adrenal hypermetabolism is without CT correlate and favored to be physiologic. 4.  Atherosclerosis, including within the coronary arteries. 5. Trace cul-de-sac fluid is nonspecific. 6. Small right larger than left pleural effusions are similar to 10/16/2014. Electronically Signed   By: Abigail Miyamoto M.D.   On: 10/24/2014 15:06     I have independently reviewed the above radiologic studies.  Recent Lab Findings: No results found for: WBC, HGB, HCT, PLT, GLUCOSE, CHOL, TRIG, HDL, LDLDIRECT, LDLCALC, ALT, AST, NA, K, CL, CREATININE, BUN, CO2, TSH, INR, GLUF, HGBA1C    Assessment / Plan:   bilateral lung lesions and  Mediastinal adenopathy suggestive of lung cancer. I have recommended proceeding with bronchoscopy, EBUS and mediastinoscopy and biopsy to obtain tissue dx.     The goals risks and alternatives of the planned surgical procedure bronchoscopy, EBUS and mediastinoscopy and biopsy have been discussed with the  patient in detail. The risks of the procedure including death, infection, stroke, myocardial infarction, bleeding, blood transfusion have all been discussed specifically.  I have quoted Hinton Dyer a 1 % of perioperative mortality and a complication rate as high as 10%. The patient's questions have been answered.Sayvion Vigen is willing  to proceed with the planned procedure.   Grace Isaac MD      [address] [place] [address] Office [phone]   Beeper [phone]  11/04/2014 7:30 AM

## 2014-11-05 ENCOUNTER — Encounter (HOSPITAL_COMMUNITY): Payer: Self-pay | Admitting: Cardiothoracic Surgery

## 2014-11-07 LAB — CULTURE, RESPIRATORY W GRAM STAIN

## 2014-11-14 NOTE — Op Note (Signed)
NAME:  Brady [name], Brady [name] NO.:  [id number]  MEDICAL RECORD NO.:  [id number]  LOCATION:  [facility]                         FACILITY:  [facility]  PHYSICIAN:  Lanelle Bal, MD    DATE OF BIRTH:  [birth year]  DATE OF PROCEDURE:  11/04/2014 DATE OF DISCHARGE:  11/04/2014                              OPERATIVE REPORT   PREOPERATIVE DIAGNOSIS:  Multiple pulmonary nodules.  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  PROCEDURE PERFORMED:  Video bronchoscopy with endobronchial ultrasound, transbronchial biopsy, #7 nodes, and biopsy of right lower lobe lung.  SURGEON:  Lanelle Bal, M.D.  BRIEF HISTORY:  The patient is a 69 year old, non smoker, who presents with 75-monthhistory of progressive infiltrates in both lungs, especially the right lower lobe.  Initial treatment with antibiotics did not resolve this.  Followup PET scan suggested metastatic disease, probable lung primary.  The patient was referred by Dr. NTressie Stalkerto obtain a tissue diagnosis.  Risks and options were discussed with the patient in detail who agreed and signed informed consent.  DESCRIPTION OF PROCEDURE:  The patient underwent general endotracheal anesthesia without incident.  A single-lumen endotracheal tube was placed.  Appropriate time-out was performed.  We preferred proceeding with video bronchoscopy.  The left tracheobronchial tree was free of any endobronchial lesions, looking in the right lower lobe, the takeoff of the right upper lobe, and the middle lobe appeared normal as he moved into the basilar segments of the right lower lobe.  There was obvious bronchial narrowing.  We then removed the video scope and placed the EBUS scope and easily located enlarged #7 nodes.  Multiple transbronchial biopsies with aspirations were made of these nodes and submitted.  Two slides for Quick Stain and the remainder of the tissue was placed in  satellite, preserving as much tissue as possible for genetic testing.  Initial smears confirmed non-small cell carcinoma, probably adenoma, with sufficient tissue for further diagnosis.  We then replaced the video scope and did brushings and multiple biopsies of the right lower lobe.  These were also submitted to Pathology.  With probable diagnosis of non-small cell carcinoma, insufficient tissue for genetic testing, we then removed the scope.  The patient tolerated the procedure without complication.  He was extubated in the operating room and transferred to the recovery room for further postoperative care.    ELanelle Bal MD    EG/MEDQ  D:  11/13/2014  T:  11/14/2014  Job:  [id number]

## 2014-11-24 ENCOUNTER — Encounter (HOSPITAL_COMMUNITY): Payer: Self-pay

## 2014-12-02 LAB — FUNGUS CULTURE W SMEAR: Fungal Smear: NONE SEEN

## 2014-12-18 LAB — AFB CULTURE WITH SMEAR (NOT AT ARMC): Acid Fast Smear: NONE SEEN

## 2014-12-18 DEATH — deceased

## 2016-11-04 IMAGING — CR DG CHEST 2V
2 series · 2 of 2 positions shown · non-contrast
Comparison: CT chest 10/16/2014

CLINICAL DATA: Pulmonary nodules

EXAM:
CHEST  2 VIEW

[w chest pa]
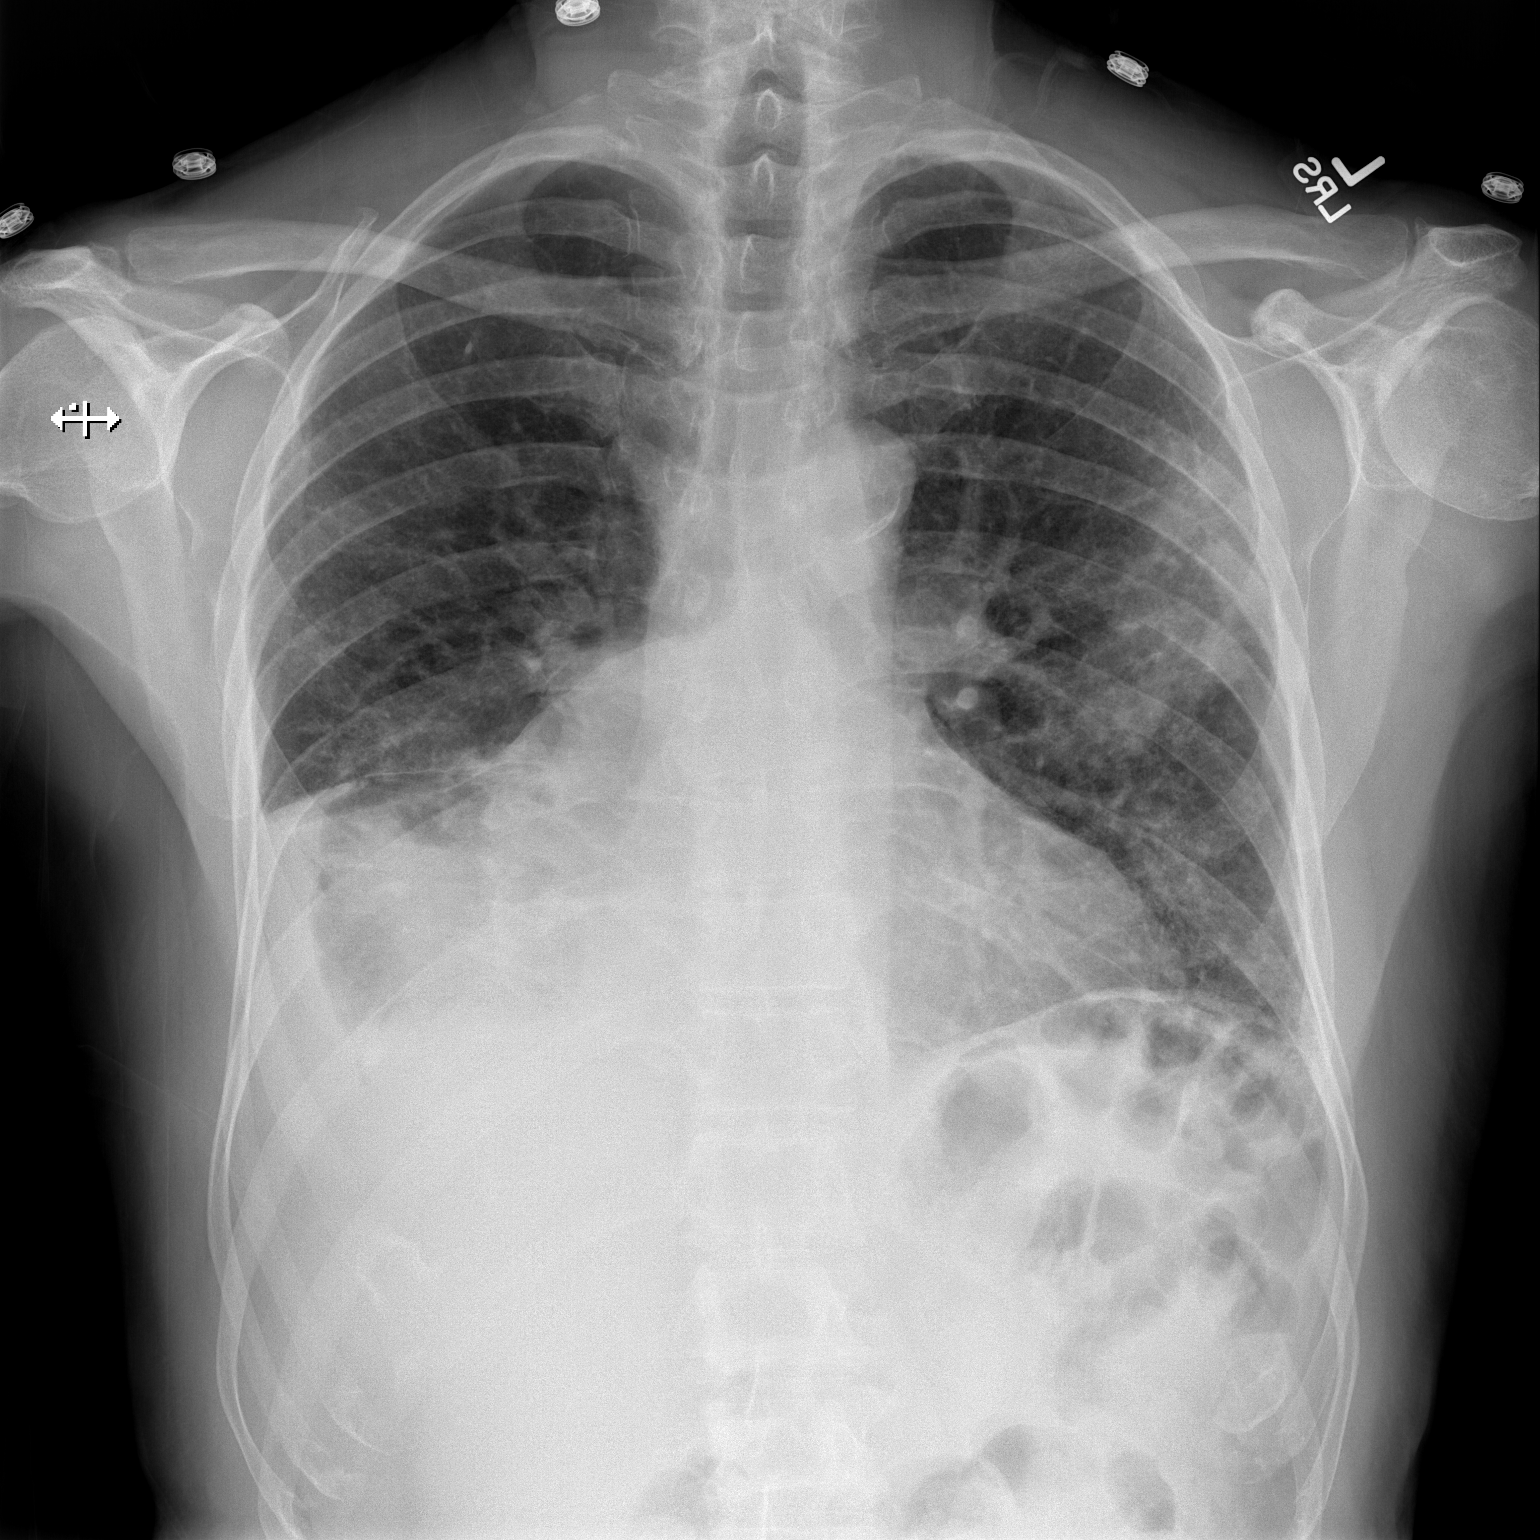

[w chest lat]
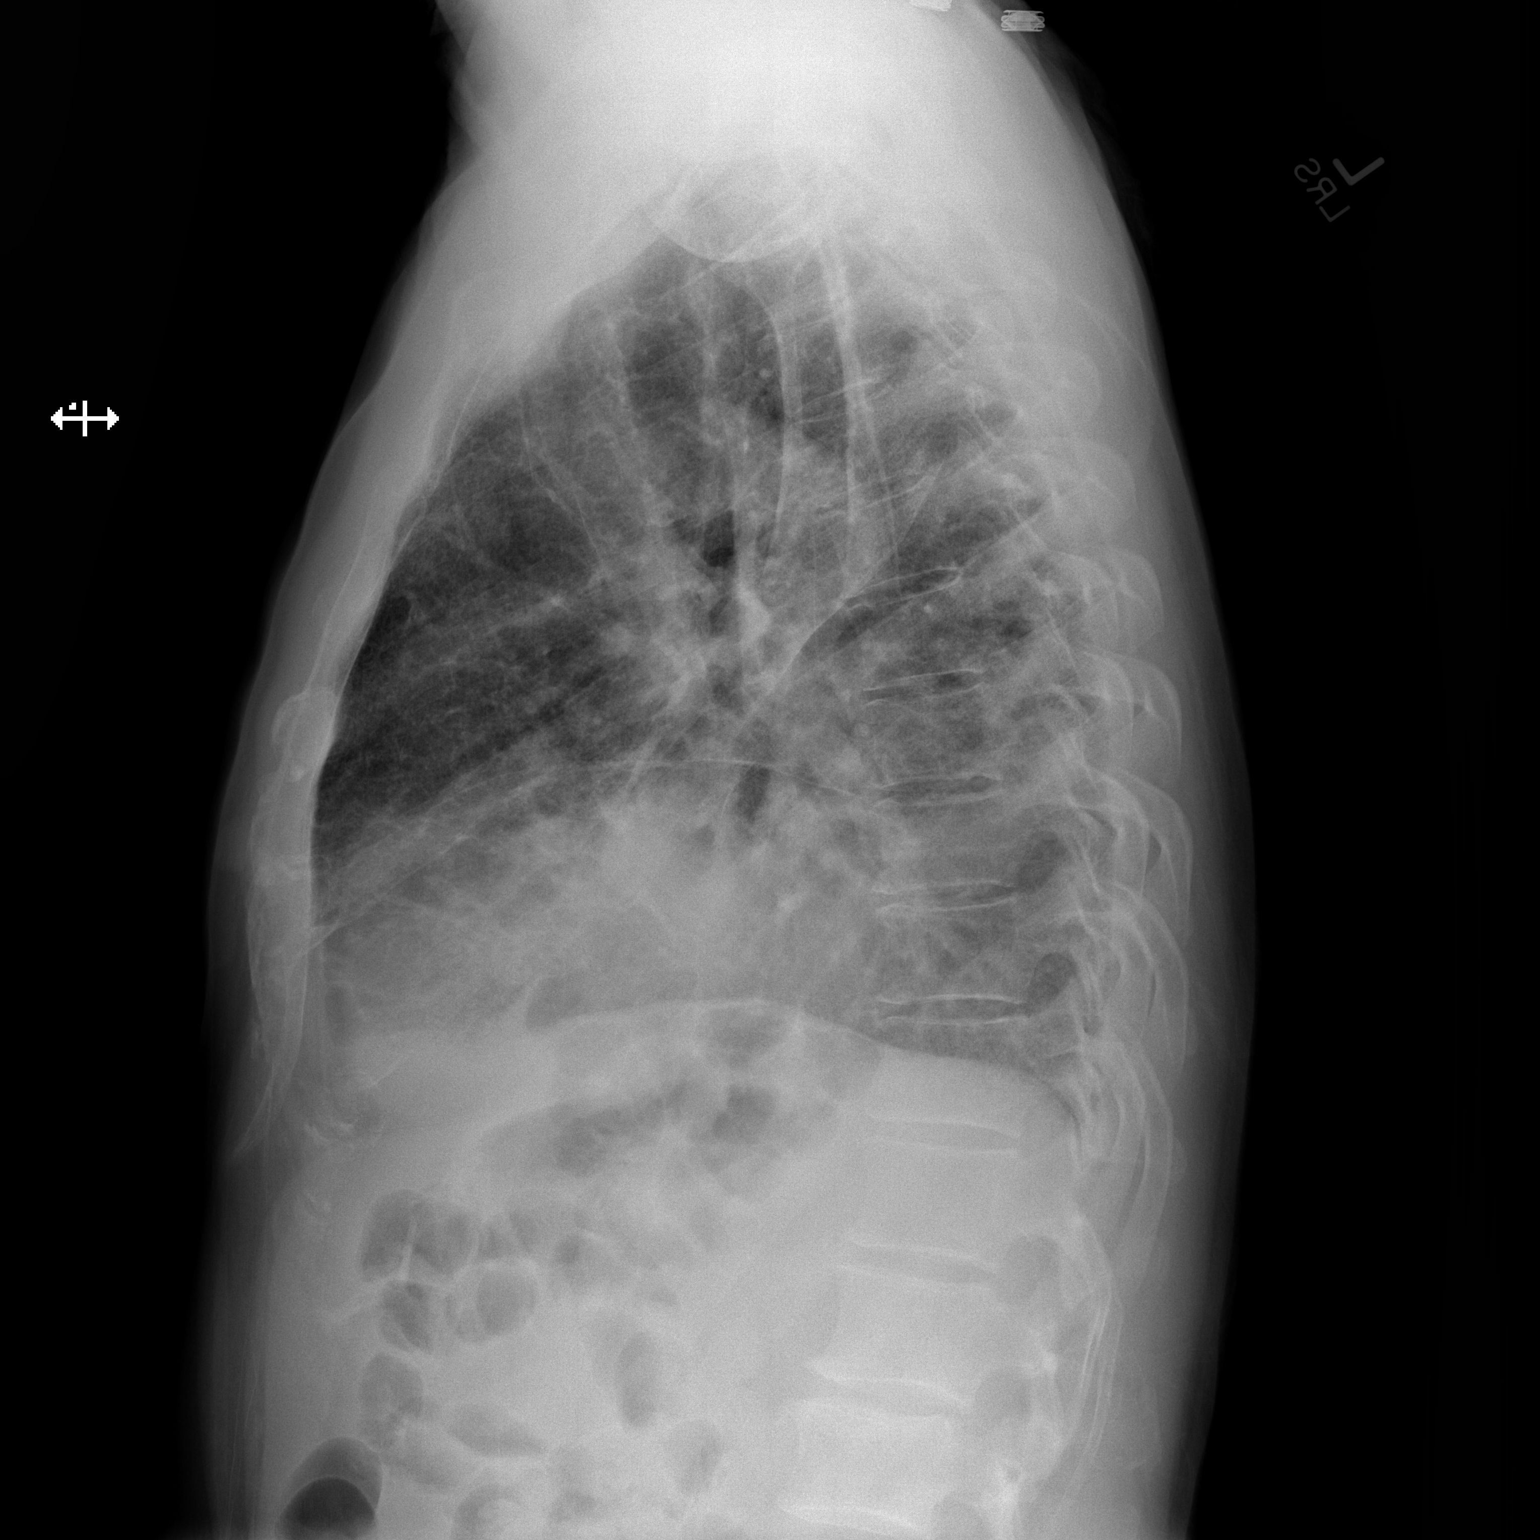

[2 of 2 positions shown; findings below may reference images not displayed]

FINDINGS: Small right pleural effusion unchanged. There is right lower lobe
consolidation compatible with atelectasis unchanged from the CT.
Right lower lobe lung nodules are better seen on prior CT

Left upper lobe mass/ infiltrate is unchanged measures and
approximately 3 x 5 cm. Mild patchy densities in the left lung base.

Negative for heart failure.  No pleural effusion on the left.
IMPRESSION: Right lower lobe consolidation and right pleural effusion unchanged.

Left upper lobe irregular density may represent a mass lesion versus
pneumonia. Smaller left lower lobe nodules. See prior CT report.
